# Patient Record
Sex: Female | Born: 1960 | Race: Black or African American | Hispanic: No | Marital: Single
Health system: Southern US, Community
[De-identification: ages and names within clinical notes are randomized; demographics above are authoritative.]

---

## 2005-02-25 ENCOUNTER — Emergency Department (HOSPITAL_COMMUNITY): Admission: EM | Admit: 2005-02-25 | Discharge: 2005-02-25 | Payer: Self-pay | Admitting: Emergency Medicine

## 2012-01-29 ENCOUNTER — Ambulatory Visit (HOSPITAL_COMMUNITY)
Admission: RE | Admit: 2012-01-29 | Discharge: 2012-01-29 | Disposition: A | Discharge status: Home / Self Care | Payer: Self-pay | Source: Ambulatory Visit | Attending: Family Medicine | Admitting: Family Medicine

## 2012-01-29 ENCOUNTER — Other Ambulatory Visit (HOSPITAL_COMMUNITY): Payer: Self-pay | Admitting: Nurse Practitioner

## 2012-01-29 DIAGNOSIS — M25469 Effusion, unspecified knee: Secondary | ICD-10-CM | POA: Insufficient documentation

## 2012-01-29 DIAGNOSIS — R52 Pain, unspecified: Secondary | ICD-10-CM

## 2012-01-29 DIAGNOSIS — M25569 Pain in unspecified knee: Secondary | ICD-10-CM | POA: Insufficient documentation

## 2012-08-05 ENCOUNTER — Other Ambulatory Visit (HOSPITAL_COMMUNITY): Payer: Self-pay | Admitting: *Deleted

## 2012-08-05 ENCOUNTER — Ambulatory Visit (HOSPITAL_COMMUNITY)
Admission: RE | Admit: 2012-08-05 | Discharge: 2012-08-05 | Disposition: A | Discharge status: Home / Self Care | Payer: Disability Insurance | Source: Ambulatory Visit | Attending: Family Medicine | Admitting: Family Medicine

## 2012-08-05 DIAGNOSIS — M79641 Pain in right hand: Secondary | ICD-10-CM

## 2012-08-05 DIAGNOSIS — M79609 Pain in unspecified limb: Secondary | ICD-10-CM | POA: Insufficient documentation

## 2012-08-05 DIAGNOSIS — M25561 Pain in right knee: Secondary | ICD-10-CM

## 2012-08-05 DIAGNOSIS — M25569 Pain in unspecified knee: Secondary | ICD-10-CM | POA: Insufficient documentation

## 2013-09-08 IMAGING — CR DG HAND 2V*R*
2 series · 2 of 2 positions shown · non-contrast
Comparison: None.

CLINICAL DATA: Pain

RIGHT HAND - 2 VIEW

[view not recorded (1 of 2)]
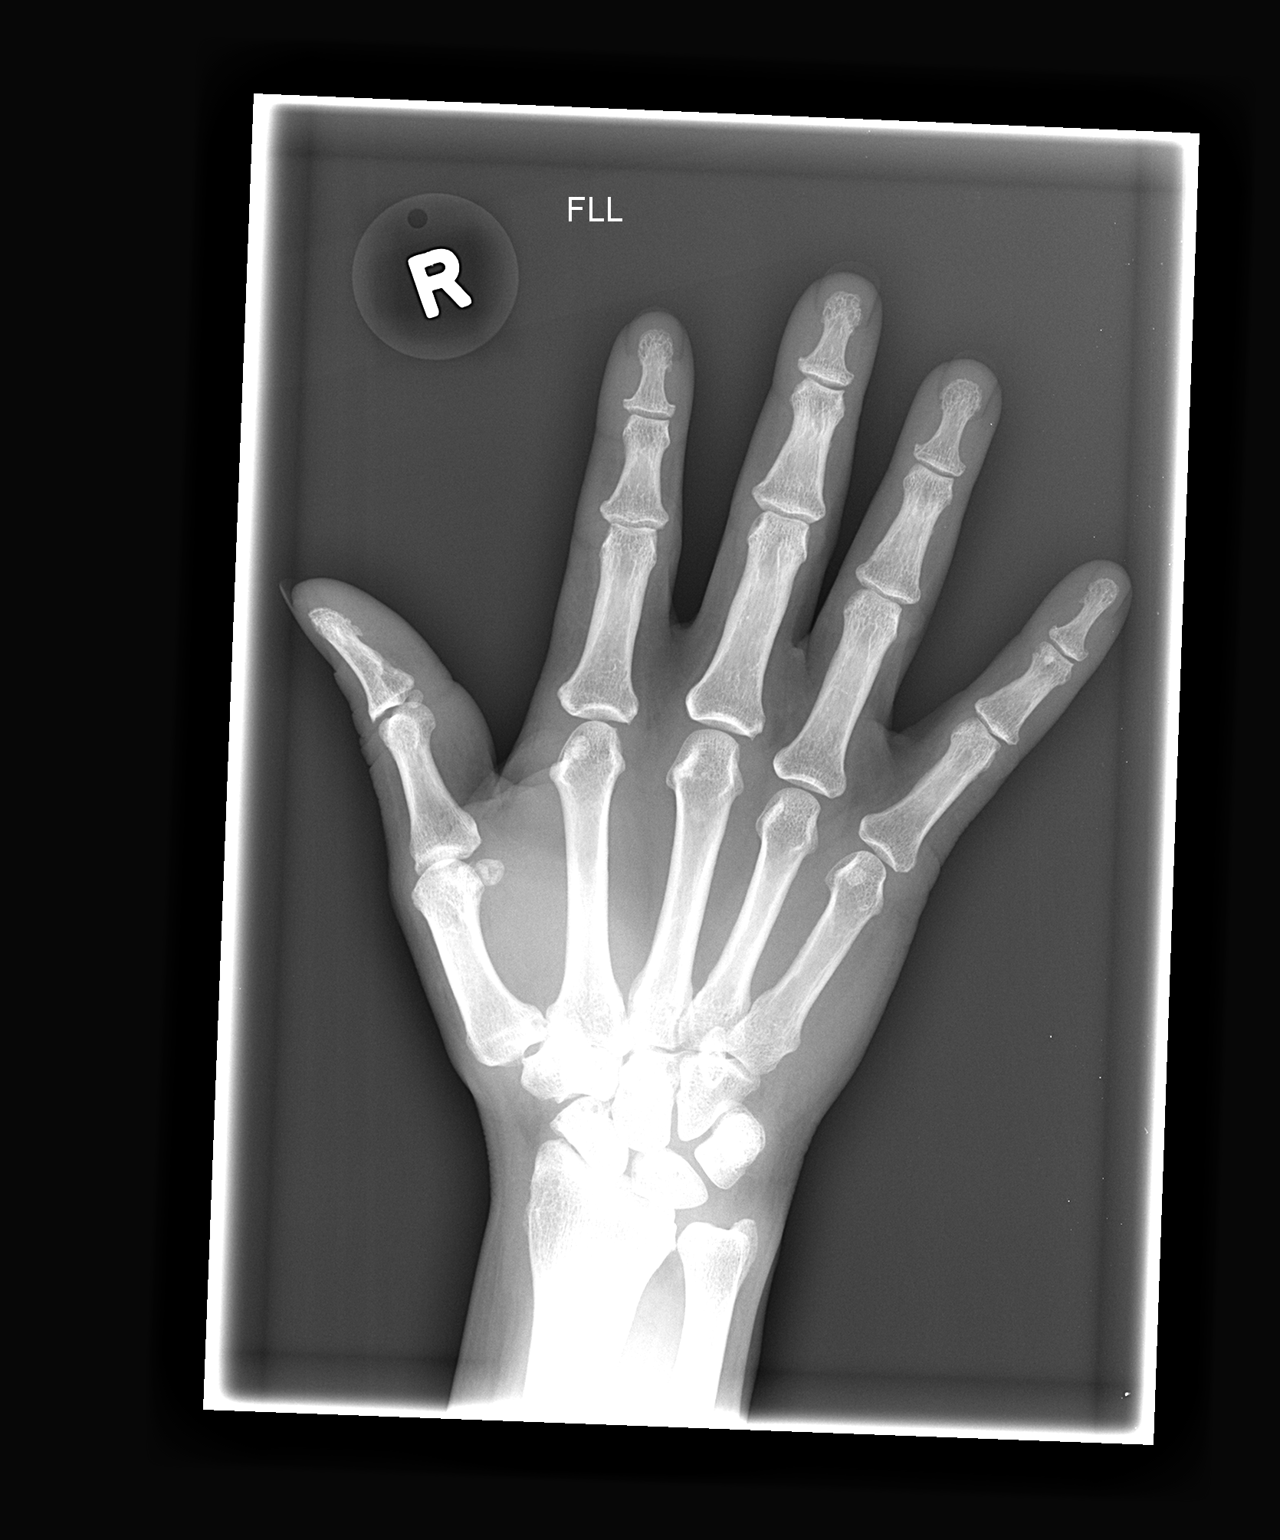

[view not recorded (2 of 2)]
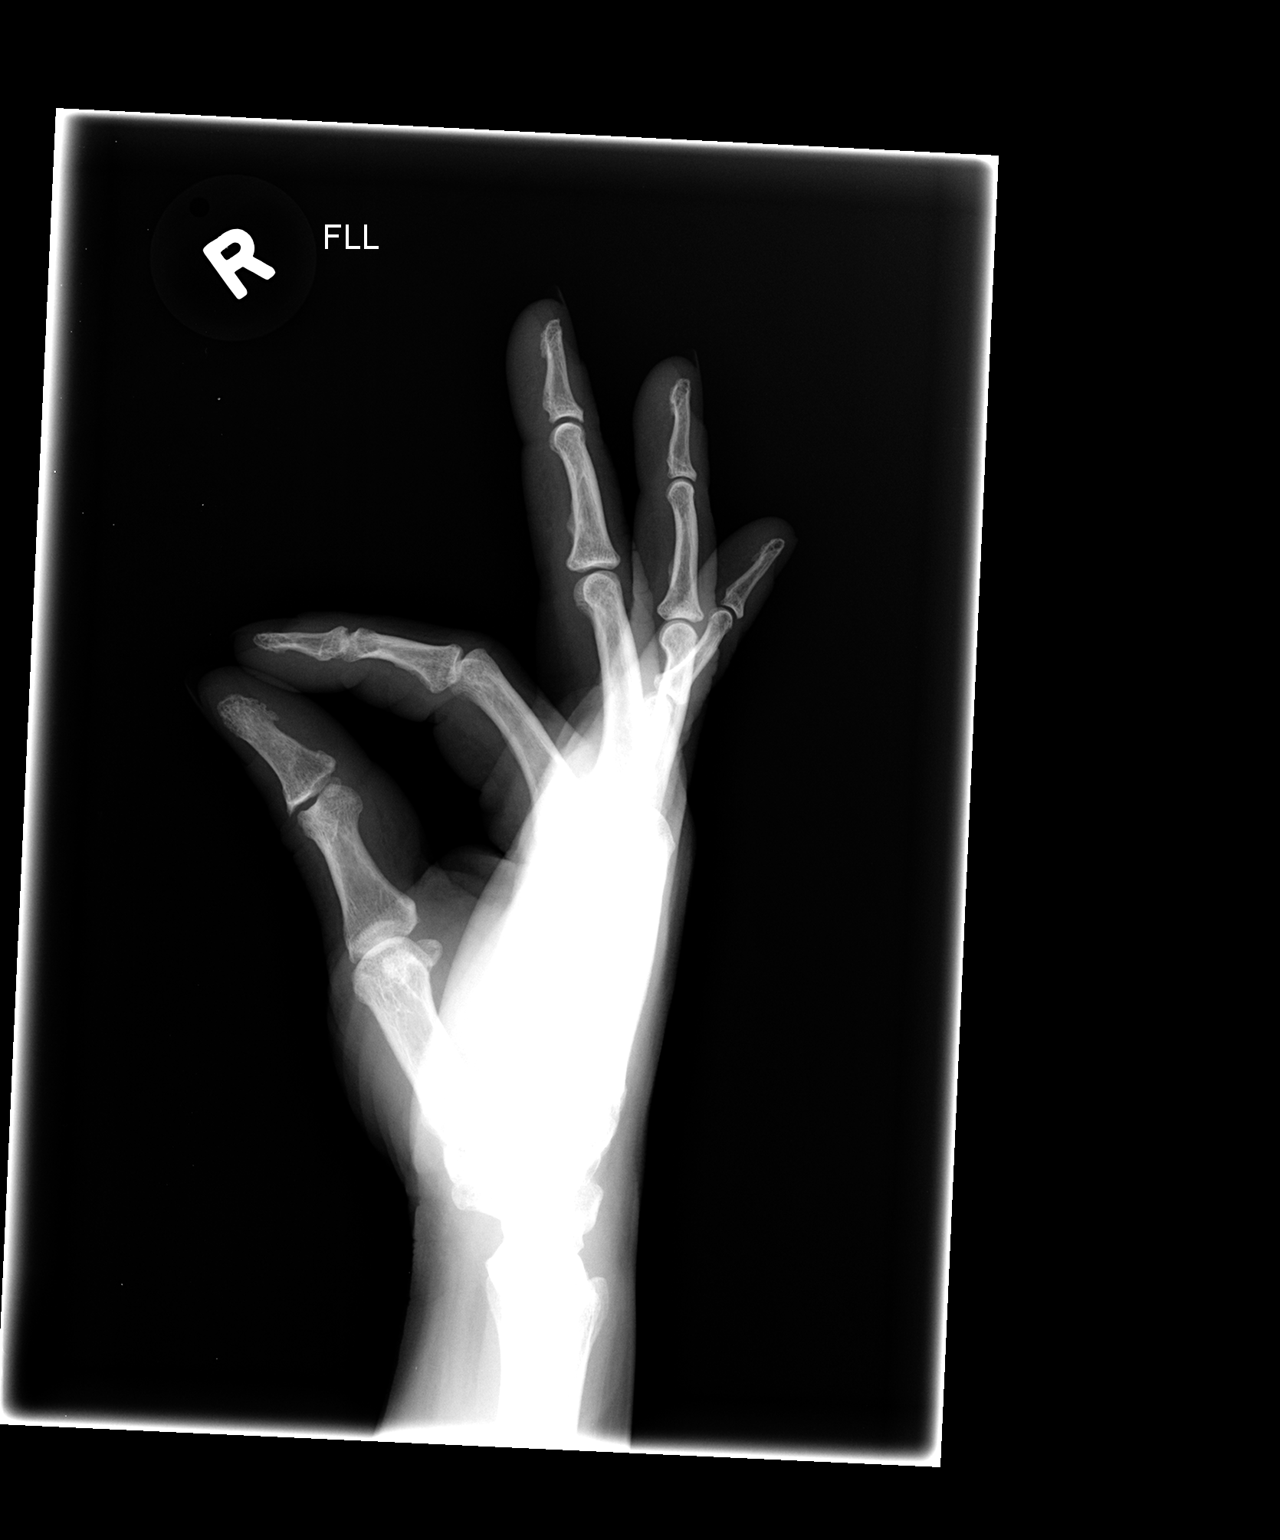

[2 of 2 positions shown; findings below may reference images not displayed]

FINDINGS: Two views of the right hand submitted.  No acute fracture
or subluxation.  Mild degenerative changes first carpal metacarpal
joint.
IMPRESSION: No acute fracture or subluxation.  Mild degenerative changes first
carpal metacarpal joint.

## 2019-08-25 ENCOUNTER — Ambulatory Visit: Payer: Disability Insurance | Attending: Internal Medicine

## 2019-08-25 ENCOUNTER — Other Ambulatory Visit: Payer: Self-pay

## 2019-08-25 DIAGNOSIS — Z23 Encounter for immunization: Secondary | ICD-10-CM | POA: Insufficient documentation

## 2019-08-25 NOTE — Progress Notes (Signed)
   Covid-19 Vaccination Clinic  Name:  Briana Nelson    MRN: [id number] DOB: 1961/01/21  08/25/2019  Briana Nelson was observed post Covid-19 immunization for 15 minutes without incidence. She was provided with Vaccine Information Sheet and instruction to access the V-Safe system.   Briana Nelson was instructed to call 911 with any severe reactions post vaccine: Marland Kitchen Difficulty breathing  . Swelling of your face and throat  . A fast heartbeat  . A bad rash all over your body  . Dizziness and weakness    Immunizations Administered    Name Date Dose VIS Date Route   Moderna COVID-19 Vaccine 08/25/2019  1:06 PM 0.5 mL 06/14/2019 Intramuscular   Manufacturer: Moderna   Lot: [id number]   NDC: 55001-642-90

## 2019-09-26 ENCOUNTER — Ambulatory Visit: Payer: Disability Insurance | Attending: Internal Medicine

## 2019-09-26 DIAGNOSIS — Z23 Encounter for immunization: Secondary | ICD-10-CM

## 2019-09-26 NOTE — Progress Notes (Signed)
   Covid-19 Vaccination Clinic  Name:  Briana Nelson    MRN: [id number] DOB: 05/14/1961  09/26/2019  Briana Nelson was observed post Covid-19 immunization for 15 minutes without incident. She was provided with Vaccine Information Sheet and instruction to access the V-Safe system.   Briana Nelson was instructed to call 911 with any severe reactions post vaccine: Marland Kitchen Difficulty breathing  . Swelling of face and throat  . A fast heartbeat  . A bad rash all over body  . Dizziness and weakness   Immunizations Administered    Name Date Dose VIS Date Route   Moderna COVID-19 Vaccine 09/26/2019 10:47 AM 0.5 mL 06/14/2019 Intramuscular   Manufacturer: Moderna   Lot: [id number]   NDC: 81859-093-11

## 2020-05-04 DIAGNOSIS — Z23 Encounter for immunization: Secondary | ICD-10-CM | POA: Diagnosis not present

## 2020-11-20 DIAGNOSIS — M545 Low back pain, unspecified: Secondary | ICD-10-CM | POA: Diagnosis not present

## 2020-11-20 DIAGNOSIS — F17210 Nicotine dependence, cigarettes, uncomplicated: Secondary | ICD-10-CM | POA: Diagnosis not present

## 2020-11-20 DIAGNOSIS — Z1231 Encounter for screening mammogram for malignant neoplasm of breast: Secondary | ICD-10-CM | POA: Diagnosis not present

## 2020-11-20 DIAGNOSIS — E559 Vitamin D deficiency, unspecified: Secondary | ICD-10-CM | POA: Diagnosis not present

## 2020-11-20 DIAGNOSIS — M329 Systemic lupus erythematosus, unspecified: Secondary | ICD-10-CM | POA: Diagnosis not present

## 2020-11-20 DIAGNOSIS — M0579 Rheumatoid arthritis with rheumatoid factor of multiple sites without organ or systems involvement: Secondary | ICD-10-CM | POA: Diagnosis not present

## 2020-11-20 DIAGNOSIS — I10 Essential (primary) hypertension: Secondary | ICD-10-CM | POA: Diagnosis not present

## 2020-11-20 DIAGNOSIS — G8929 Other chronic pain: Secondary | ICD-10-CM | POA: Diagnosis not present

## 2020-11-22 ENCOUNTER — Other Ambulatory Visit (HOSPITAL_COMMUNITY): Payer: Self-pay | Admitting: Adult Health Nurse Practitioner

## 2020-11-22 DIAGNOSIS — Z1231 Encounter for screening mammogram for malignant neoplasm of breast: Secondary | ICD-10-CM

## 2021-04-24 DIAGNOSIS — Z23 Encounter for immunization: Secondary | ICD-10-CM | POA: Diagnosis not present

## 2021-04-25 DIAGNOSIS — Z23 Encounter for immunization: Secondary | ICD-10-CM | POA: Diagnosis not present

## 2021-05-24 DIAGNOSIS — F17210 Nicotine dependence, cigarettes, uncomplicated: Secondary | ICD-10-CM | POA: Diagnosis not present

## 2021-05-24 DIAGNOSIS — E559 Vitamin D deficiency, unspecified: Secondary | ICD-10-CM | POA: Diagnosis not present

## 2021-05-24 DIAGNOSIS — Z0000 Encounter for general adult medical examination without abnormal findings: Secondary | ICD-10-CM | POA: Diagnosis not present

## 2021-05-24 DIAGNOSIS — Z23 Encounter for immunization: Secondary | ICD-10-CM | POA: Diagnosis not present

## 2021-05-24 DIAGNOSIS — I10 Essential (primary) hypertension: Secondary | ICD-10-CM | POA: Diagnosis not present

## 2021-05-24 DIAGNOSIS — M545 Low back pain, unspecified: Secondary | ICD-10-CM | POA: Diagnosis not present

## 2021-05-24 DIAGNOSIS — L2381 Allergic contact dermatitis due to animal (cat) (dog) dander: Secondary | ICD-10-CM | POA: Diagnosis not present

## 2021-05-24 DIAGNOSIS — M329 Systemic lupus erythematosus, unspecified: Secondary | ICD-10-CM | POA: Diagnosis not present

## 2021-05-24 DIAGNOSIS — M0579 Rheumatoid arthritis with rheumatoid factor of multiple sites without organ or systems involvement: Secondary | ICD-10-CM | POA: Diagnosis not present

## 2022-07-16 DIAGNOSIS — Z7689 Persons encountering health services in other specified circumstances: Secondary | ICD-10-CM | POA: Diagnosis not present

## 2022-07-31 DIAGNOSIS — M2550 Pain in unspecified joint: Secondary | ICD-10-CM | POA: Diagnosis not present

## 2022-07-31 DIAGNOSIS — I10 Essential (primary) hypertension: Secondary | ICD-10-CM | POA: Diagnosis not present

## 2022-07-31 DIAGNOSIS — Z1211 Encounter for screening for malignant neoplasm of colon: Secondary | ICD-10-CM | POA: Diagnosis not present

## 2022-07-31 DIAGNOSIS — Z23 Encounter for immunization: Secondary | ICD-10-CM | POA: Diagnosis not present

## 2022-07-31 DIAGNOSIS — M329 Systemic lupus erythematosus, unspecified: Secondary | ICD-10-CM | POA: Diagnosis not present

## 2022-07-31 DIAGNOSIS — M0579 Rheumatoid arthritis with rheumatoid factor of multiple sites without organ or systems involvement: Secondary | ICD-10-CM | POA: Diagnosis not present

## 2022-07-31 DIAGNOSIS — Z0000 Encounter for general adult medical examination without abnormal findings: Secondary | ICD-10-CM | POA: Diagnosis not present

## 2022-07-31 DIAGNOSIS — Z1231 Encounter for screening mammogram for malignant neoplasm of breast: Secondary | ICD-10-CM | POA: Diagnosis not present

## 2022-07-31 DIAGNOSIS — F17210 Nicotine dependence, cigarettes, uncomplicated: Secondary | ICD-10-CM | POA: Diagnosis not present

## 2022-08-05 ENCOUNTER — Other Ambulatory Visit (HOSPITAL_COMMUNITY): Payer: Self-pay | Admitting: Adult Health Nurse Practitioner

## 2022-08-05 ENCOUNTER — Encounter (HOSPITAL_COMMUNITY): Payer: Self-pay | Admitting: Adult Health Nurse Practitioner

## 2022-08-05 DIAGNOSIS — Z1231 Encounter for screening mammogram for malignant neoplasm of breast: Secondary | ICD-10-CM

## 2022-08-18 ENCOUNTER — Ambulatory Visit (HOSPITAL_COMMUNITY)
Admission: RE | Admit: 2022-08-18 | Discharge: 2022-08-18 | Disposition: A | Discharge status: Home / Self Care | Payer: Medicare HMO | Source: Ambulatory Visit | Attending: Adult Health Nurse Practitioner | Admitting: Adult Health Nurse Practitioner

## 2022-08-18 DIAGNOSIS — Z1231 Encounter for screening mammogram for malignant neoplasm of breast: Secondary | ICD-10-CM | POA: Insufficient documentation

## 2022-08-20 ENCOUNTER — Encounter (HOSPITAL_COMMUNITY): Payer: Self-pay | Admitting: Adult Health Nurse Practitioner

## 2022-08-21 ENCOUNTER — Other Ambulatory Visit (HOSPITAL_COMMUNITY): Payer: Self-pay | Admitting: Adult Health Nurse Practitioner

## 2022-08-21 DIAGNOSIS — R928 Other abnormal and inconclusive findings on diagnostic imaging of breast: Secondary | ICD-10-CM

## 2022-08-28 ENCOUNTER — Ambulatory Visit (HOSPITAL_COMMUNITY): Payer: Medicare HMO

## 2022-08-28 ENCOUNTER — Encounter (HOSPITAL_COMMUNITY): Payer: Disability Insurance

## 2024-07-29 ENCOUNTER — Emergency Department (HOSPITAL_COMMUNITY)

## 2024-07-29 ENCOUNTER — Emergency Department (HOSPITAL_COMMUNITY)
Admission: EM | Admit: 2024-07-29 | Discharge: 2024-07-29 | Disposition: A | Discharge status: Home / Self Care | Attending: Emergency Medicine | Admitting: Emergency Medicine

## 2024-07-29 DIAGNOSIS — M549 Dorsalgia, unspecified: Secondary | ICD-10-CM | POA: Insufficient documentation

## 2024-07-29 DIAGNOSIS — S0990XA Unspecified injury of head, initial encounter: Secondary | ICD-10-CM | POA: Insufficient documentation

## 2024-07-29 DIAGNOSIS — S199XXA Unspecified injury of neck, initial encounter: Secondary | ICD-10-CM | POA: Insufficient documentation

## 2024-07-29 DIAGNOSIS — T148XXA Other injury of unspecified body region, initial encounter: Secondary | ICD-10-CM

## 2024-07-29 DIAGNOSIS — Y92410 Unspecified street and highway as the place of occurrence of the external cause: Secondary | ICD-10-CM | POA: Insufficient documentation

## 2024-07-29 DIAGNOSIS — S4991XA Unspecified injury of right shoulder and upper arm, initial encounter: Secondary | ICD-10-CM | POA: Insufficient documentation

## 2024-07-29 MED ORDER — METHOCARBAMOL 500 MG PO TABS: 500.0000 mg | ORAL_TABLET | Freq: Two times a day (BID) | ORAL | 0 refills | Status: AC | PRN
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
